# Patient Record
Sex: Female | Born: 2005 | Race: White | Hispanic: Yes | Marital: Single | Smoking: Never smoker
Health system: Southern US, Community
[De-identification: ages and names within clinical notes are randomized; demographics above are authoritative.]

## PROBLEM LIST (undated history)

## (undated) DIAGNOSIS — R519 Headache, unspecified: Secondary | ICD-10-CM

## (undated) DIAGNOSIS — K5900 Constipation, unspecified: Secondary | ICD-10-CM

## (undated) DIAGNOSIS — F419 Anxiety disorder, unspecified: Secondary | ICD-10-CM

## (undated) DIAGNOSIS — T7840XA Allergy, unspecified, initial encounter: Secondary | ICD-10-CM

## (undated) DIAGNOSIS — H539 Unspecified visual disturbance: Secondary | ICD-10-CM

## (undated) HISTORY — PX: TONSILLECTOMY: SUR1361

---
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
## 2024-01-02 ENCOUNTER — Other Ambulatory Visit: Payer: Self-pay

## 2024-01-02 ENCOUNTER — Emergency Department (HOSPITAL_BASED_OUTPATIENT_CLINIC_OR_DEPARTMENT_OTHER)
Admission: EM | Admit: 2024-01-02 | Discharge: 2024-01-02 | Disposition: A | Discharge status: Home / Self Care | Attending: Emergency Medicine | Admitting: Emergency Medicine

## 2024-01-02 ENCOUNTER — Encounter (HOSPITAL_BASED_OUTPATIENT_CLINIC_OR_DEPARTMENT_OTHER): Payer: Self-pay

## 2024-01-02 DIAGNOSIS — W540XXA Bitten by dog, initial encounter: Secondary | ICD-10-CM | POA: Insufficient documentation

## 2024-01-02 DIAGNOSIS — S51851A Open bite of right forearm, initial encounter: Secondary | ICD-10-CM | POA: Diagnosis not present

## 2024-01-02 DIAGNOSIS — S61031A Puncture wound without foreign body of right thumb without damage to nail, initial encounter: Secondary | ICD-10-CM | POA: Insufficient documentation

## 2024-01-02 DIAGNOSIS — S59911A Unspecified injury of right forearm, initial encounter: Secondary | ICD-10-CM | POA: Diagnosis present

## 2024-01-02 DIAGNOSIS — S41151A Open bite of right upper arm, initial encounter: Secondary | ICD-10-CM

## 2024-01-02 DIAGNOSIS — S51831A Puncture wound without foreign body of right forearm, initial encounter: Secondary | ICD-10-CM | POA: Insufficient documentation

## 2024-01-02 DIAGNOSIS — S51031A Puncture wound without foreign body of right elbow, initial encounter: Secondary | ICD-10-CM | POA: Diagnosis not present

## 2024-01-02 LAB — PREGNANCY, URINE: Preg Test, Ur: NEGATIVE

## 2024-01-02 MED ORDER — DOXYCYCLINE HYCLATE 100 MG PO TABS
100.0000 mg | ORAL_TABLET | Freq: Once | ORAL | Status: AC
  Administered 2024-01-02: 100 mg via ORAL
  Filled 2024-01-02: qty 1

## 2024-01-02 MED ORDER — CLINDAMYCIN HCL 300 MG PO CAPS: 300.0000 mg | ORAL_CAPSULE | Freq: Three times a day (TID) | ORAL | 0 refills | Status: AC

## 2024-01-02 MED ORDER — CLINDAMYCIN HCL 150 MG PO CAPS
300.0000 mg | ORAL_CAPSULE | Freq: Once | ORAL | Status: AC
  Administered 2024-01-02: 300 mg via ORAL
  Filled 2024-01-02: qty 2

## 2024-01-02 MED ORDER — DOXYCYCLINE HYCLATE 100 MG PO CAPS: 100.0000 mg | ORAL_CAPSULE | Freq: Two times a day (BID) | ORAL | 0 refills | Status: AC

## 2024-01-02 NOTE — ED Notes (Signed)
 Cleaned wounds with wound cleanser. Pt tolerated well.

## 2024-01-02 NOTE — ED Provider Notes (Signed)
 [place] EMERGENCY DEPARTMENT AT [facility] Provider Note   CSN: [id number] Arrival date & time: 01/02/24  1111     Patient presents with: Animal Bite   Grace Cervantes is a 18 y.o. female who presents with concern for dog bite to her right forearm and right hand.  States this occurred earlier today, just prior to arrival.  This was a dog that was unknown to her, but with an owner, unsure of vaccination status.    Animal Bite      Prior to Admission medications   Medication Sig Start Date End Date Taking? Authorizing Provider  clindamycin (CLEOCIN) 300 MG capsule Take 1 capsule (300 mg total) by mouth 3 (three) times daily for 7 days. 01/02/24 01/09/24 Yes Veta Palma, PA-C  doxycycline (VIBRAMYCIN) 100 MG capsule Take 1 capsule (100 mg total) by mouth 2 (two) times daily. 01/02/24  Yes Veta Palma, PA-C  ibuprofen  (ADVIL ) 600 MG tablet Take 1 tablet (600 mg total) by mouth every 6 (six) hours as needed. Patient not taking: Reported on 01/20/2023 09/20/21   Viviana Fitch T, FNP  mirtazapine  (REMERON ) 7.5 MG tablet Take 1 tablet (7.5 mg total) by mouth at bedtime. Patient not taking: Reported on 01/20/2023 12/24/21   Viviana Fitch T, FNP  Multiple Vitamin (MULTIVITAMIN WITH MINERALS) TABS tablet Take 1 tablet by mouth daily. Patient not taking: Reported on 01/20/2023 12/16/21   Joshua Bari HERO, NP  ondansetron  (ZOFRAN ) 4 MG tablet Take 1 tablet (4 mg total) by mouth every 8 (eight) hours as needed for nausea or vomiting. 04/17/22   Joshua Bari HERO, NP  pantoprazole  (PROTONIX ) 40 MG tablet Take 1 tablet (40 mg total) by mouth daily. Patient not taking: Reported on 01/20/2023 12/24/21   Viviana Fitch DASEN, FNP  senna (SENOKOT) 8.6 MG TABS tablet Take 1 tablet (8.6 mg total) by mouth at bedtime. Patient not taking: Reported on 01/20/2023 12/24/21   Viviana Fitch T, FNP    Allergies: Amoxicillin    Review of Systems  Skin:  Positive for wound.    Updated  Vital Signs BP 123/75 (BP Location: Right Arm)   Pulse 82   Temp 97.9 F (36.6 C)   Resp 20   Wt 48.6 kg   SpO2 100%   Physical Exam Vitals and nursing note reviewed.  Constitutional:      Appearance: Normal appearance.  HENT:     Head: Atraumatic.  Cardiovascular:     Comments: 2+ radial pulse bilaterally Pulmonary:     Effort: Pulmonary effort is normal.  Musculoskeletal:     Comments: Small superficial punctate dog bites to the right forearm and right thumb.  Bleeding well-controlled.  No visualization of bone.  Full range of motion of the right elbow, right wrist, and first fifth digits of the right hand.  Neurological:     General: No focal deficit present.     Mental Status: She is alert.  Psychiatric:        Mood and Affect: Mood normal.        Behavior: Behavior normal.               (all labs ordered are listed, but only abnormal results are displayed) Labs Reviewed  PREGNANCY, URINE    EKG: None  Radiology: No results found.   Procedures   Medications Ordered in the ED  doxycycline (VIBRA-TABS) tablet 100 mg (has no administration in time range)  clindamycin (CLEOCIN) capsule 300 mg (has no administration in time  range)                                    Medical Decision Making Amount and/or Complexity of Data Reviewed Labs: ordered.  Risk Prescription drug management.     Differential diagnosis includes but is not limited to dog bite, tendon injury, vascular injury and cellulitis  ED Course:  Upon initial evaluation, patient is well-appearing, no acute distress.  Stable vitals.  Sustained superficial dog bite to the right forearm and right thumb just prior to arrival.  On exam, the sites are not actively bleeding.  Very superficial, no visualization of bone.  No foreign debris noted in wound sites.  Patient neurovascularly intact in the right upper extremity.  There is no surrounding erythema, purulent drainage, no concern for  infection at this time.  Bites were personally irrigated by myself with sterile saline. She is penicillin allergic, will start on course of doxycycline and clindamycin for prevention of infection. Dog was unknown to patient, but appeared to be a Loss adjuster, chartered with owner there.  Did not appear rabid.  I offered patient rabies vaccine series, patient declines rabies vaccine at this time.  Patient stable and appropriate for discharge home at this time  Labs Ordered: I Ordered, and personally interpreted labs.  The pertinent results include:   Urine pregnancy negative Medications Given: Clindamycin Doxycycline  Impression: Dog bite  Disposition:  The patient was discharged home with instructions to take 7-day course of doxycycline and clindamycin as prescribed.  Keep bite sites clean with soap and water. Return precautions given.     This chart was dictated using voice recognition software, Dragon. Despite the best efforts of this provider to proofread and correct errors, errors may still occur which can change documentation meaning.       Final diagnoses:  Dog bite of right arm, initial encounter    ED Discharge Orders          Ordered    clindamycin (CLEOCIN) 300 MG capsule  3 times daily        01/02/24 1403    doxycycline (VIBRAMYCIN) 100 MG capsule  2 times daily        01/02/24 1403               Veta Palma, PA-C 01/02/24 1416    Cottie Donnice PARAS, MD 01/02/24 1527

## 2024-01-02 NOTE — Discharge Instructions (Addendum)
 You were seen here today for a dog bite.  Please keep the bite sites clean with soap and water.  Allow them to heal over on their own.  You have been prescribed 2 antibiotics to prevent infection.  The first antibiotic is called doxycycline.  Please take this twice a day for the next 7 days.  You were given your first dose here today.  The next antibiotic is called clindamycin.  Please take this 3 times a day for the next 7 days.  You were given your first dose here today.  Take the full course of your antibiotic even if you start feeling better. Antibiotics may cause you to have diarrhea.  Please return to the ER for any other emergent concerns

## 2024-01-02 NOTE — ED Triage Notes (Signed)
 Pt c/o dog bite R arm, unsure of dogs vaccination status.

## 2024-01-02 NOTE — ED Notes (Signed)
Reviewed discharge instructions, medications, and home care with pt. Pt verbalized understanding and had no further questions. Pt exited ED without complications.

## 2024-02-13 DIAGNOSIS — N946 Dysmenorrhea, unspecified: Secondary | ICD-10-CM | POA: Diagnosis not present

## 2024-02-13 DIAGNOSIS — F5082 Avoidant/restrictive food intake disorder: Secondary | ICD-10-CM | POA: Diagnosis not present

## 2024-02-13 DIAGNOSIS — Z0000 Encounter for general adult medical examination without abnormal findings: Secondary | ICD-10-CM | POA: Diagnosis not present

## 2024-04-02 DIAGNOSIS — K120 Recurrent oral aphthae: Secondary | ICD-10-CM | POA: Diagnosis not present
